# Patient Record
Sex: Male | Born: 1955 | Race: Black or African American | Hispanic: No | Marital: Married | Smoking: Former smoker
Health system: Southern US, Community
[De-identification: ages and names within clinical notes are randomized; demographics above are authoritative.]

## PROBLEM LIST (undated history)

## (undated) DIAGNOSIS — M1990 Unspecified osteoarthritis, unspecified site: Secondary | ICD-10-CM

## (undated) DIAGNOSIS — Z86010 Personal history of colonic polyps: Principal | ICD-10-CM

## (undated) DIAGNOSIS — I10 Essential (primary) hypertension: Secondary | ICD-10-CM

## (undated) HISTORY — DX: Personal history of colonic polyps: Z86.010

## (undated) HISTORY — DX: Unspecified osteoarthritis, unspecified site: M19.90

## (undated) HISTORY — DX: Essential (primary) hypertension: I10

## (undated) HISTORY — PX: COLONOSCOPY: SHX174

---

## 2005-11-20 ENCOUNTER — Ambulatory Visit: Payer: Self-pay | Admitting: Family Medicine

## 2007-01-10 ENCOUNTER — Ambulatory Visit: Payer: Self-pay | Admitting: Family Medicine

## 2007-01-10 DIAGNOSIS — I10 Essential (primary) hypertension: Secondary | ICD-10-CM | POA: Insufficient documentation

## 2007-02-08 ENCOUNTER — Ambulatory Visit: Payer: Self-pay | Admitting: Family Medicine

## 2007-02-08 LAB — CONVERTED CEMR LAB
ALT: 26 units/L (ref 0–53)
AST: 28 units/L (ref 0–37)
Alkaline Phosphatase: 56 units/L (ref 39–117)
BUN: 15 mg/dL (ref 6–23)
Basophils Relative: 0.6 % (ref 0.0–1.0)
Bilirubin, Direct: 0.1 mg/dL (ref 0.0–0.3)
Blood in Urine, dipstick: NEGATIVE
CO2: 31 meq/L (ref 19–32)
Calcium: 10 mg/dL (ref 8.4–10.5)
Chloride: 100 meq/L (ref 96–112)
Cholesterol: 246 mg/dL (ref 0–200)
Eosinophils Absolute: 0.1 10*3/uL (ref 0.0–0.6)
Eosinophils Relative: 0.6 % (ref 0.0–5.0)
GFR calc Af Amer: 92 mL/min
GFR calc non Af Amer: 76 mL/min
Glucose, Bld: 95 mg/dL (ref 70–99)
HDL: 36.2 mg/dL — ABNORMAL LOW (ref >39.0)
Ketones, urine, test strip: NEGATIVE
Monocytes Relative: 7.2 % (ref 3.0–11.0)
Nitrite: NEGATIVE
Platelets: 326 10*3/uL (ref 150–400)
Protein, U semiquant: NEGATIVE
RBC: 4.15 M/uL — ABNORMAL LOW (ref 4.22–5.81)
TSH: 2.11 microintl units/mL (ref 0.35–5.50)
Triglycerides: 134 mg/dL (ref 0–149)
Urobilinogen, UA: 0.2
VLDL: 27 mg/dL (ref 0–40)
WBC: 10.5 10*3/uL (ref 4.5–10.5)

## 2007-02-19 ENCOUNTER — Ambulatory Visit: Payer: Self-pay | Admitting: Family Medicine

## 2007-02-19 DIAGNOSIS — E785 Hyperlipidemia, unspecified: Secondary | ICD-10-CM | POA: Insufficient documentation

## 2007-02-19 DIAGNOSIS — Z87891 Personal history of nicotine dependence: Secondary | ICD-10-CM

## 2007-08-13 ENCOUNTER — Ambulatory Visit: Payer: Self-pay | Admitting: Family Medicine

## 2007-09-12 ENCOUNTER — Ambulatory Visit: Payer: Self-pay | Admitting: Internal Medicine

## 2007-09-26 ENCOUNTER — Ambulatory Visit: Payer: Self-pay | Admitting: Internal Medicine

## 2007-09-26 ENCOUNTER — Encounter: Payer: Self-pay | Admitting: Internal Medicine

## 2007-09-26 DIAGNOSIS — Z860100 Personal history of colon polyps, unspecified: Secondary | ICD-10-CM | POA: Insufficient documentation

## 2007-09-26 HISTORY — DX: Personal history of colonic polyps: Z86.010

## 2007-09-26 HISTORY — DX: Personal history of colon polyps, unspecified: Z86.0100

## 2007-09-30 ENCOUNTER — Encounter: Payer: Self-pay | Admitting: Internal Medicine

## 2008-07-17 ENCOUNTER — Telehealth: Payer: Self-pay | Admitting: Family Medicine

## 2008-07-27 ENCOUNTER — Telehealth: Payer: Self-pay | Admitting: Family Medicine

## 2008-10-27 ENCOUNTER — Telehealth: Payer: Self-pay | Admitting: Family Medicine

## 2008-11-10 ENCOUNTER — Ambulatory Visit: Payer: Self-pay | Admitting: Family Medicine

## 2008-11-10 LAB — CONVERTED CEMR LAB
ALT: 28 units/L (ref 0–53)
Albumin: 4.3 g/dL (ref 3.5–5.2)
BUN: 18 mg/dL (ref 6–23)
Bilirubin, Direct: 0.1 mg/dL (ref 0.0–0.3)
Blood in Urine, dipstick: NEGATIVE
Calcium: 9.7 mg/dL (ref 8.4–10.5)
Cholesterol: 215 mg/dL — ABNORMAL HIGH (ref 0–200)
Creatinine, Ser: 1.3 mg/dL (ref 0.4–1.5)
Eosinophils Relative: 0.4 % (ref 0.0–5.0)
GFR calc non Af Amer: 74.72 mL/min (ref >60)
Glucose, Bld: 106 mg/dL — ABNORMAL HIGH (ref 70–99)
Glucose, Urine, Semiquant: NEGATIVE
HCT: 41.5 % (ref 39.0–52.0)
HDL: 41.3 mg/dL (ref >39.00)
Lymphocytes Relative: 34.3 % (ref 12.0–46.0)
Monocytes Relative: 6.7 % (ref 3.0–12.0)
Neutrophils Relative %: 58.2 % (ref 43.0–77.0)
PSA: 0.87 ng/mL (ref 0.10–4.00)
Platelets: 292 10*3/uL (ref 150.0–400.0)
RDW: 12.6 % (ref 11.5–14.6)
Specific Gravity, Urine: >=1.03
Total Protein: 7.9 g/dL (ref 6.0–8.3)
Triglycerides: 149 mg/dL (ref 0.0–149.0)
VLDL: 29.8 mg/dL (ref 0.0–40.0)
WBC Urine, dipstick: NEGATIVE
WBC: 9.6 10*3/uL (ref 4.5–10.5)
pH: 5.5

## 2008-11-17 ENCOUNTER — Ambulatory Visit: Payer: Self-pay | Admitting: Family Medicine

## 2008-11-17 DIAGNOSIS — N529 Male erectile dysfunction, unspecified: Secondary | ICD-10-CM | POA: Insufficient documentation

## 2008-12-01 ENCOUNTER — Ambulatory Visit: Payer: Self-pay | Admitting: Family Medicine

## 2008-12-04 ENCOUNTER — Telehealth: Payer: Self-pay | Admitting: Family Medicine

## 2010-03-29 ENCOUNTER — Telehealth: Payer: Self-pay | Admitting: Family Medicine

## 2010-04-04 ENCOUNTER — Ambulatory Visit: Admit: 2010-04-04 | Payer: Self-pay | Admitting: Family Medicine

## 2010-04-28 NOTE — Progress Notes (Signed)
Summary: refill  Phone Note Refill Request Call back at Home Phone [phone] Message from:  Patient--live call  Refills Requested: Medication #1:  DYAZIDE 37.5-25 MG CAPS Take 1 tablet by mouth every morning has appt next monday.  Initial call taken by: Warnell Forester,  March 29, 2010 11:07 AM    Prescriptions: Ilsa Iha 37.5-25 MG CAPS (TRIAMTERENE-HCTZ) Take 1 tablet by mouth every morning  #30 x 0   Entered by:   Kern Reap CMA (AAMA)   Authorized by:   Roderick Pee MD   Signed by:   Kern Reap CMA (AAMA) on 03/29/2010   Method used:   Electronically to        [facility]  [address] [phone]* (retail)       [address]       [place], [place]  [address]       Ph: [phone]       Fax: [phone]   RxID:   [id number]

## 2012-09-16 ENCOUNTER — Encounter: Payer: Self-pay | Admitting: Internal Medicine

## 2012-09-19 ENCOUNTER — Ambulatory Visit (AMBULATORY_SURGERY_CENTER): Payer: BC Managed Care – PPO | Admitting: *Deleted

## 2012-09-19 VITALS — Ht 72.0 in | Wt 260.8 lb

## 2012-09-19 DIAGNOSIS — Z1211 Encounter for screening for malignant neoplasm of colon: Secondary | ICD-10-CM

## 2012-09-19 MED ORDER — NA SULFATE-K SULFATE-MG SULF 17.5-3.13-1.6 GM/177ML PO SOLN: ORAL | Status: DC

## 2012-09-20 ENCOUNTER — Encounter: Payer: Self-pay | Admitting: Internal Medicine

## 2012-10-03 ENCOUNTER — Ambulatory Visit (AMBULATORY_SURGERY_CENTER): Payer: BC Managed Care – PPO | Admitting: Internal Medicine

## 2012-10-03 ENCOUNTER — Encounter: Payer: Self-pay | Admitting: Internal Medicine

## 2012-10-03 VITALS — BP 114/66 | HR 54 | Temp 97.0°F | Resp 18 | Ht 72.0 in | Wt 260.0 lb

## 2012-10-03 DIAGNOSIS — D126 Benign neoplasm of colon, unspecified: Secondary | ICD-10-CM

## 2012-10-03 DIAGNOSIS — Z860100 Personal history of colon polyps, unspecified: Secondary | ICD-10-CM

## 2012-10-03 MED ORDER — SODIUM CHLORIDE 0.9 % IV SOLN: 500.0000 mL | INTRAVENOUS | Status: DC

## 2012-10-03 NOTE — Op Note (Addendum)
[facility] [address] [place], [address]   COLONOSCOPY PROCEDURE REPORT  PATIENT: Steven Morton, Steven Morton  MR#: [id number] BIRTHDATE: September 27, 1955 , 57  yrs. old GENDER: Male ENDOSCOPIST: Iva Boop, MD, [facility] PROCEDURE DATE:  10/03/2012 PROCEDURE:   Colonoscopy with biopsy ASA CLASS:   Class II INDICATIONS:Patient's personal history of adenomatous colon polyps and Last colonoscopy performed 5 years ago. MEDICATIONS: propofol (Diprivan) 200mg  IV, MAC sedation, administered by CRNA, and These medications were titrated to patient response per physician's verbal order  DESCRIPTION OF PROCEDURE:   After the risks benefits and alternatives of the procedure were thoroughly explained, informed consent was obtained.  A digital rectal exam revealed no abnormalities of the rectum, A digital rectal exam revealed no prostatic nodules, and A digital rectal exam revealed the prostate was not enlarged.   The     endoscope was introduced through the anus and advanced to the cecum, which was identified by both the appendix and ileocecal valve (photo lost). No adverse events experienced.   The quality of the prep was excellent using Suprep The instrument was then slowly withdrawn as the colon was fully examined.      COLON FINDINGS: A sessile polyp measuring 2 mm in size was found in the proximal transverse colon.  A polypectomy was performed with cold forceps.  The resection was complete and the polyp tissue was completely retrieved.   The colon mucosa was otherwise normal.   A right colon retroflexion was performed.  Retroflexed views revealed no abnormalities. The time to cecum=1 minutes 0 seconds. Withdrawal time=8 minutes 0 seconds.  The scope was withdrawn and the procedure completed. COMPLICATIONS: There were no complications.  ENDOSCOPIC IMPRESSION: 1.   Sessile polyp measuring 2 mm in size was found in the proximal transverse colon; polypectomy was performed with  cold forceps 2.   The colon mucosa was otherwise normal - excellent prep  - second colonoscopy  RECOMMENDATIONS: Timing of repeat colonoscopy will be determined by pathology findings in man with 2 diminutive adenomas removed 2009 He does not want SuPrep next time.   eSigned:  Iva Boop, MD, [facility] 10/03/2012 9:31 AM Revised: 10/03/2012 9:31 AM  cc: The Patient  and Mayra Neer, NP [facility])

## 2012-10-03 NOTE — Progress Notes (Addendum)
Patient did not have preoperative order for IV antibiotic SSI prophylaxis. (G8918)  Patient did not experience any of the following events: a burn prior to discharge; a fall within the facility; wrong site/side/patient/procedure/implant event; or a hospital transfer or hospital admission upon discharge from the facility. (G8907)  

## 2012-10-03 NOTE — Progress Notes (Signed)
Called to room to assist during endoscopic procedure.  Patient ID and intended procedure confirmed with present staff. Received instructions for my participation in the procedure from the performing physician.  

## 2012-10-03 NOTE — Patient Instructions (Addendum)
I found and removed one tiny polyp. Otherwise normal colon with great prep.  I will let you know pathology results and when to have another routine colonoscopy by mail.  I appreciate the opportunity to care for you. Iva Boop, MD, FACG YOU HAD AN ENDOSCOPIC PROCEDURE TODAY AT THE [facility]: Refer to the procedure report that was given to you for any specific questions about what was found during the examination.  If the procedure report does not answer your questions, please call your gastroenterologist to clarify.  If you requested that your care partner not be given the details of your procedure findings, then the procedure report has been included in a sealed envelope for you to review at your convenience later.  YOU SHOULD EXPECT: Some feelings of bloating in the abdomen. Passage of more gas than usual.  Walking can help get rid of the air that was put into your GI tract during the procedure and reduce the bloating. If you had a lower endoscopy (such as a colonoscopy or flexible sigmoidoscopy) you may notice spotting of blood in your stool or on the toilet paper. If you underwent a bowel prep for your procedure, then you may not have a normal bowel movement for a few days.  DIET: Your first meal following the procedure should be a light meal and then it is ok to progress to your normal diet.  A half-sandwich or bowl of soup is an example of a good first meal.  Heavy or fried foods are harder to digest and may make you feel nauseous or bloated.  Likewise meals heavy in dairy and vegetables can cause extra gas to form and this can also increase the bloating.  Drink plenty of fluids but you should avoid alcoholic beverages for 24 hours.  ACTIVITY: Your care partner should take you home directly after the procedure.  You should plan to take it easy, moving slowly for the rest of the day.  You can resume normal activity the day after the procedure however you should NOT DRIVE or use  heavy machinery for 24 hours (because of the sedation medicines used during the test).    SYMPTOMS TO REPORT IMMEDIATELY: A gastroenterologist can be reached at any hour.  During normal business hours, 8:30 AM to 5:00 PM Monday through Friday, call [phone].  After hours and on weekends, please call the GI answering service at [phone] who will take a message and have the physician on call contact you.   Following lower endoscopy (colonoscopy or flexible sigmoidoscopy):  Excessive amounts of blood in the stool  Significant tenderness or worsening of abdominal pains  Swelling of the abdomen that is new, acute  Fever of 100F or higher  FOLLOW UP: If any biopsies were taken you will be contacted by phone or by letter within the next 1-3 weeks.  Call your gastroenterologist if you have not heard about the biopsies in 3 weeks.  Our staff will call the home number listed on your records the next business day following your procedure to check on you and address any questions or concerns that you may have at that time regarding the information given to you following your procedure. This is a courtesy call and so if there is no answer at the home number and we have not heard from you through the emergency physician on call, we will assume that you have returned to your regular daily activities without incident.  SIGNATURES/CONFIDENTIALITY: You and/or your care  partner have signed paperwork which will be entered into your electronic medical record.  These signatures attest to the fact that that the information above on your After Visit Summary has been reviewed and is understood.  Full responsibility of the confidentiality of this discharge information lies with you and/or your care-partner.

## 2012-10-03 NOTE — Progress Notes (Signed)
Lidocaine-40mg IV prior to Propofol InductionPropofol given over incremental dosages 

## 2012-10-04 ENCOUNTER — Telehealth: Payer: Self-pay | Admitting: *Deleted

## 2012-10-04 NOTE — Telephone Encounter (Signed)
  Follow up Call-  Call back number 10/03/2012  Post procedure Call Back phone  # [phone]  Permission to leave phone message Yes     Patient questions:  Do you have a fever, pain , or abdominal swelling? no Pain Score  0 *  Have you tolerated food without any problems? yes  Have you been able to return to your normal activities? yes  Do you have any questions about your discharge instructions: Diet   no Medications  no Follow up visit  no  Do you have questions or concerns about your Care? no  Actions: * If pain score is 4 or above: No action needed, pain <4.

## 2012-10-08 ENCOUNTER — Encounter: Payer: Self-pay | Admitting: Internal Medicine

## 2012-10-08 NOTE — Progress Notes (Signed)
Quick Note:  Not a polyp Repeat colon 09/2022 - 2 small adenomas in past ______

## 2016-04-10 ENCOUNTER — Encounter: Payer: Self-pay | Admitting: Podiatry

## 2016-04-10 ENCOUNTER — Ambulatory Visit (INDEPENDENT_AMBULATORY_CARE_PROVIDER_SITE_OTHER): Payer: 59 | Admitting: Podiatry

## 2016-04-10 ENCOUNTER — Ambulatory Visit (INDEPENDENT_AMBULATORY_CARE_PROVIDER_SITE_OTHER): Payer: 59

## 2016-04-10 VITALS — BP 143/93 | HR 99 | Ht 73.0 in | Wt 267.0 lb

## 2016-04-10 DIAGNOSIS — L600 Ingrowing nail: Secondary | ICD-10-CM | POA: Diagnosis not present

## 2016-04-10 DIAGNOSIS — M21619 Bunion of unspecified foot: Secondary | ICD-10-CM

## 2016-04-10 NOTE — Progress Notes (Signed)
   Subjective:    Patient ID: Steven Morton, male    DOB: 1955/08/25, 61 y.o.   MRN: [id number]  HPI  Chief Complaint  Patient presents with  . Toe Pain    Right foot; 3rd toe x 2 weeks. Pt states that the 2nd toe rubs against the 3rd toe and causes pain when wearing shoes.   . Bunions    BL. Denies having pain.        Review of Systems     Objective:   Physical Exam        Assessment & Plan:

## 2016-04-10 NOTE — Patient Instructions (Signed)

## 2016-04-11 NOTE — Progress Notes (Signed)
Subjective:     Patient ID: Steven Morton, male   DOB: 1955-07-19, 61 y.o.   MRN: [id number]  HPI patient presents stating he's had a history of bunion deformity which can be bothersome at times but currently the third toe of his right foot is what hurting the most has been present for several months   Review of Systems  All other systems reviewed and are negative.      Objective:   Physical Exam  Constitutional: He is oriented to person, place, and time.  Cardiovascular: Intact distal pulses.   Musculoskeletal: Normal range of motion.  Neurological: He is oriented to person, place, and time.  Skin: Skin is warm.  Nursing note and vitals reviewed.  neurovascular status intact with muscle strength adequate and range of motion within normal limits. Patient's noted to have hyperostosis around the medial aspect first metatarsal head bilateral with redness and deviation of the hallux against the second toe and compression between the lesser digits right over left. There is irritation of the medial border of the right third nail which is inflamed and appears to be the major point that he is developing pain. I noted good digital perfusion and well oriented 3     Assessment:     Structural malalignment of the foot bilateral with possibility for ingrown toenail third right versus spur formation or compression from digital structural    Plan:     H&P x-rays reviewed and today were to focus on the nail. I infiltrated the third digit 60 mg Xylocaine Marcaine mixture reviewed with him risk associated with nail surgery and remove the medial border exposing matrix and applying phenol 3 applications 30 seconds followed by alcohol lavage and sterile dressing. Gave instructions on soaks and reappoint to recheck and ultimately will probably require bunion surgery at his convenience  X-ray report indicated that there is elevation of the intermetatarsal angle right over left with deviation of the lesser  digits

## 2018-04-05 ENCOUNTER — Emergency Department (HOSPITAL_COMMUNITY): Payer: PRIVATE HEALTH INSURANCE

## 2018-04-05 ENCOUNTER — Encounter (HOSPITAL_COMMUNITY): Payer: Self-pay

## 2018-04-05 ENCOUNTER — Emergency Department (HOSPITAL_COMMUNITY)
Admission: EM | Admit: 2018-04-05 | Discharge: 2018-04-05 | Disposition: A | Discharge status: Home / Self Care | Payer: PRIVATE HEALTH INSURANCE | Attending: Emergency Medicine | Admitting: Emergency Medicine

## 2018-04-05 ENCOUNTER — Other Ambulatory Visit: Payer: Self-pay

## 2018-04-05 DIAGNOSIS — I10 Essential (primary) hypertension: Secondary | ICD-10-CM | POA: Diagnosis not present

## 2018-04-05 DIAGNOSIS — K858 Other acute pancreatitis without necrosis or infection: Secondary | ICD-10-CM | POA: Insufficient documentation

## 2018-04-05 DIAGNOSIS — R1010 Upper abdominal pain, unspecified: Secondary | ICD-10-CM | POA: Diagnosis present

## 2018-04-05 DIAGNOSIS — Z79899 Other long term (current) drug therapy: Secondary | ICD-10-CM | POA: Diagnosis not present

## 2018-04-05 DIAGNOSIS — I77819 Aortic ectasia, unspecified site: Secondary | ICD-10-CM | POA: Insufficient documentation

## 2018-04-05 LAB — CBC
HCT: 45.4 % (ref 39.0–52.0)
Hemoglobin: 15.5 g/dL (ref 13.0–17.0)
MCH: 33.1 pg (ref 26.0–34.0)
MCHC: 34.1 g/dL (ref 30.0–36.0)
MCV: 97 fL (ref 80.0–100.0)
Platelets: 301 10*3/uL (ref 150–400)
RBC: 4.68 MIL/uL (ref 4.22–5.81)
RDW: 12 % (ref 11.5–15.5)
WBC: 12.7 10*3/uL — ABNORMAL HIGH (ref 4.0–10.5)
nRBC: 0 % (ref 0.0–0.2)

## 2018-04-05 LAB — URINALYSIS, ROUTINE W REFLEX MICROSCOPIC
Bilirubin Urine: NEGATIVE
Glucose, UA: NEGATIVE mg/dL
HGB URINE DIPSTICK: NEGATIVE
Ketones, ur: NEGATIVE mg/dL
Leukocytes, UA: NEGATIVE
Nitrite: NEGATIVE
PH: 5 (ref 5.0–8.0)
Protein, ur: NEGATIVE mg/dL
Specific Gravity, Urine: 1.024 (ref 1.005–1.030)

## 2018-04-05 LAB — COMPREHENSIVE METABOLIC PANEL
ALT: 26 U/L (ref 0–44)
AST: 22 U/L (ref 15–41)
Albumin: 4 g/dL (ref 3.5–5.0)
Alkaline Phosphatase: 62 U/L (ref 38–126)
Anion gap: 10 (ref 5–15)
BUN: 13 mg/dL (ref 6–20)
CO2: 24 mmol/L (ref 22–32)
Calcium: 9.2 mg/dL (ref 8.9–10.3)
Chloride: 100 mmol/L (ref 98–111)
Creatinine, Ser: 1.11 mg/dL (ref 0.61–1.24)
GFR calc Af Amer: >60 mL/min (ref >60)
GFR calc non Af Amer: >60 mL/min (ref >60)
GLUCOSE: 160 mg/dL — AB (ref 70–99)
Potassium: 3.3 mmol/L — ABNORMAL LOW (ref 3.5–5.1)
SODIUM: 134 mmol/L — AB (ref 135–145)
Total Bilirubin: 0.8 mg/dL (ref 0.3–1.2)
Total Protein: 7.7 g/dL (ref 6.5–8.1)

## 2018-04-05 LAB — LIPASE, BLOOD: LIPASE: 900 U/L — AB (ref 11–51)

## 2018-04-05 LAB — I-STAT TROPONIN, ED: Troponin i, poc: 0.01 ng/mL (ref 0.00–0.08)

## 2018-04-05 MED ORDER — SODIUM CHLORIDE 0.9 % IV BOLUS
1000.0000 mL | Freq: Once | INTRAVENOUS | Status: AC
  Administered 2018-04-05: 1000 mL via INTRAVENOUS

## 2018-04-05 MED ORDER — ONDANSETRON 4 MG PO TBDP: 4.0000 mg | ORAL_TABLET | Freq: Three times a day (TID) | ORAL | 0 refills | Status: AC | PRN

## 2018-04-05 MED ORDER — IOHEXOL 300 MG/ML  SOLN
100.0000 mL | Freq: Once | INTRAMUSCULAR | Status: AC | PRN
  Administered 2018-04-05: 100 mL via INTRAVENOUS

## 2018-04-05 MED ORDER — HYDROMORPHONE HCL 1 MG/ML IJ SOLN
1.0000 mg | Freq: Once | INTRAMUSCULAR | Status: AC
  Administered 2018-04-05: 1 mg via INTRAVENOUS
  Filled 2018-04-05: qty 1

## 2018-04-05 MED ORDER — ONDANSETRON HCL 4 MG/2ML IJ SOLN
4.0000 mg | Freq: Once | INTRAMUSCULAR | Status: AC
  Administered 2018-04-05: 4 mg via INTRAVENOUS
  Filled 2018-04-05: qty 2

## 2018-04-05 MED ORDER — OXYCODONE HCL 5 MG PO TABS: 5.0000 mg | ORAL_TABLET | ORAL | 0 refills | Status: AC | PRN

## 2018-04-05 NOTE — ED Notes (Signed)
Patient verbalizes understanding of discharge instructions. Opportunity for questioning and answers were provided. Armband removed by staff, pt discharged from ED ambulatory with wife driving home.

## 2018-04-05 NOTE — ED Provider Notes (Signed)
[facility]ERGENCY DEPARTMENT Provider Note   CSN: [id number] Arrival date & time: 04/05/18  0334     History   Chief Complaint Chief Complaint  Patient presents with  . Abdominal Pain    HPI Steven Morton is a 63 y.o. male.  HPI   63 yo M with PMHx HTN, colonic adenoma here with abdominal pain. Pt states his sx started over a week ago. At that time, he had a "cold" with cough, congestion. He then developed epigastric pain after eating that he thought was from coughing. He saw his PCP and was treated supportively, but did had labs that were reportedly abnormal. However, when the PCP called about labs the pt was asymptomatic 4 days ago, so pt was told to continue to monitor. Over the past 24 hours, he has had recurrence of now severe aching, gnawing, severe, epigastric pain that is constant. It is worse with palpation and eating. He has not taken anything for it. Denies h/o similar sx. No known h/o gallstones, no h/o pancreatitis. Denies any regular alcohol intake, though he did drink on New Years 10 days ago. No fever or chills. No urinary symptoms.  Past Medical History:  Diagnosis Date  . Arthritis    left knee  . Hypertension   . Personal history of colonic adenomas 09/26/2007    Patient Active Problem List   Diagnosis Date Noted  . 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 Personal history of colonic adenomas 09/26/2007  . [place], SEVERE 02/19/2007  . TOBACCO ABUSE, HX OF 02/19/2007  . HYPERTENSION 01/10/2007    Past Surgical History:  Procedure Laterality Date  . COLONOSCOPY          Home Medications    Prior to Admission medications   Medication Sig Start Date End Date Taking? Authorizing Provider  diclofenac (VOLTAREN) 75 MG EC tablet Take 75 mg by mouth 2 (two) times daily.    [provider]  lisinopril-hydrochlorothiazide (PRINZIDE,ZESTORETIC) 20-25 MG per tablet Take 1 tablet by mouth daily.    [provider]   ondansetron (ZOFRAN ODT) 4 MG disintegrating tablet Take 1 tablet (4 mg total) by mouth every 8 (eight) hours as needed for nausea or vomiting. 04/05/18   Duffy Bruce, MD  oxyCODONE (ROXICODONE) 5 MG immediate release tablet Take 1-2 tablets (5-10 mg total) by mouth every 4 (four) hours as needed for moderate pain or severe pain (1 tab for moderate pain, 2 tabs for severe pain). 04/05/18   Duffy Bruce, MD    Family History Family History  Problem Relation Age of Onset  . Colon cancer Neg Hx     Social History Social History   Tobacco Use  . Smoking status: Former Smoker    Last attempt to quit: 06/06/2012    Years since quitting: 5.8  . Smokeless tobacco: Never Used  Substance Use Topics  . Alcohol use: Yes    Comment: 2 drinks a month  . Drug use: No     Allergies   Patient has no known allergies.   Review of Systems Review of Systems  Constitutional: Positive for fatigue. Negative for chills and fever.  HENT: Negative for congestion and rhinorrhea.   Eyes: Negative for visual disturbance.  Respiratory: Negative for cough, shortness of breath and wheezing.   Cardiovascular: Negative for chest pain and leg swelling.  Gastrointestinal: Positive for abdominal pain and nausea. Negative for diarrhea and vomiting.  Genitourinary: Negative for dysuria and flank pain.  Musculoskeletal: Negative for  neck pain and neck stiffness.  Skin: Negative for rash and wound.  Allergic/Immunologic: Negative for immunocompromised state.  Neurological: Negative for syncope, weakness and headaches.  All other systems reviewed and are negative.    Physical Exam Updated Vital Signs BP 117/71   Pulse 75   Temp 98.8 F (37.1 C) (Oral)   Resp (!) 21   Ht 6\' 1"  (1.854 m)   Wt 118.8 kg   SpO2 92%   BMI 34.57 kg/m   Physical Exam Vitals signs and nursing note reviewed.  Constitutional:      General: He is not in acute distress.    Appearance: He is well-developed.  HENT:      Head: Normocephalic and atraumatic.  Eyes:     Conjunctiva/sclera: Conjunctivae normal.  Neck:     Musculoskeletal: Neck supple.  Cardiovascular:     Rate and Rhythm: Normal rate and regular rhythm.     Heart sounds: Normal heart sounds. No murmur. No friction rub.  Pulmonary:     Effort: Pulmonary effort is normal. No respiratory distress.     Breath sounds: Normal breath sounds. No wheezing or rales.  Abdominal:     General: There is no distension.     Palpations: Abdomen is soft.     Tenderness: There is abdominal tenderness in the right upper quadrant and epigastric area. There is guarding. There is no rebound.  Skin:    General: Skin is warm.     Capillary Refill: Capillary refill takes less than 2 seconds.  Neurological:     Mental Status: He is alert and oriented to person, place, and time.     Motor: No abnormal muscle tone.      ED Treatments / Results  Labs (all labs ordered are listed, but only abnormal results are displayed) Labs Reviewed  LIPASE, BLOOD - Abnormal; Notable for the following components:      Result Value   Lipase 900 (*)    All other components within normal limits  COMPREHENSIVE METABOLIC PANEL - Abnormal; Notable for the following components:   Sodium 134 (*)    Potassium 3.3 (*)    Glucose, Bld 160 (*)    All other components within normal limits  CBC - Abnormal; Notable for the following components:   WBC 12.7 (*)    All other components within normal limits  URINALYSIS, ROUTINE W REFLEX MICROSCOPIC  I-STAT TROPONIN, ED    EKG EKG Interpretation  Date/Time:  Friday April 05 2018 04:56:49 EST Ventricular Rate:  87 PR Interval:    QRS Duration: 79 QT Interval:  352 QTC Calculation: 424 R Axis:   -10 Text Interpretation:  Sinus rhythm Low voltage, precordial leads No significant change since last tracing Confirmed by Duffy Bruce [phone]) on 04/05/2018 5:10:41 AM   Radiology Dg Chest 2 View  Result Date: 04/05/2018 CLINICAL  DATA:  Cough and abdominal pain EXAM: CHEST - 2 VIEW COMPARISON:  None. FINDINGS: Normal heart size and mediastinal contours. No acute infiltrate or edema. No effusion or pneumothorax. Spondylosis. No acute osseous findings. IMPRESSION: No evidence of active disease. Electronically Signed   By: Monte Fantasia M.D.   On: 04/05/2018 05:54   Ct Abdomen Pelvis W Contrast  Result Date: 04/05/2018 CLINICAL DATA:  63 y/o M; upper abdominal pain below the sternal notch. EXAM: CT ABDOMEN AND PELVIS WITH CONTRAST TECHNIQUE: Multidetector CT imaging of the abdomen and pelvis was performed using the standard protocol following bolus administration of intravenous contrast. CONTRAST:  159mL OMNIPAQUE IOHEXOL 300 MG/ML  SOLN COMPARISON:  None. FINDINGS: Lower chest: No acute abnormality. Hepatobiliary: No focal liver abnormality is seen. No gallstones, gallbladder wall thickening, or biliary dilatation. Pancreas: Edema surrounding head of pancreas. No acute peripancreatic collection or findings of pancreatic necrosis. Spleen: Normal in size without focal abnormality. Adrenals/Urinary Tract: Adrenal glands are unremarkable. Kidneys are normal, without renal calculi, focal lesion, or hydronephrosis. Bladder is unremarkable. Stomach/Bowel: Stomach is within normal limits. Appendix appears normal. No evidence of bowel wall thickening, distention, or inflammatory changes. Vascular/Lymphatic: Aortic atherosclerosis. No enlarged abdominal or pelvic lymph nodes. 26 mm abdominal aortic ectasia at the bifurcation. Reproductive: Prostate is unremarkable. Other: 15 mm dermal cyst in the midline lumbar region. Musculoskeletal: No fracture is seen. Multilevel degenerative changes of the visible thoracic and lumbar spine. IMPRESSION: 1. Edema surrounding head of pancreas, consistent with acute pancreatitis. No acute peripancreatic collection or findings of pancreatic necrosis. 2. 26 mm abdominal aortic ectasia at the bifurcation. Ectatic  abdominal aorta at risk for aneurysm development. Recommend followup by ultrasound in 5 years. This recommendation follows ACR consensus guidelines: White Paper of the ACR Incidental Findings Committee II on Vascular Findings. J Am Coll Radiol 2013; 10:789-794. Aortic aneurysm NOS (ICD10-I71.9) 3.  Aortic Atherosclerosis (ICD10-I70.0). Electronically Signed   By: Kristine Garbe M.D.   On: 04/05/2018 06:15    Procedures Procedures (including critical care time)  Medications Ordered in ED Medications  HYDROmorphone (DILAUDID) injection 1 mg (1 mg Intravenous Given 04/05/18 0512)  ondansetron (ZOFRAN) injection 4 mg (4 mg Intravenous Given 04/05/18 0512)  sodium chloride 0.9 % bolus 1,000 mL (0 mLs Intravenous Stopped 04/05/18 0717)  iohexol (OMNIPAQUE) 300 MG/ML solution 100 mL (100 mLs Intravenous Contrast Given 04/05/18 0539)  HYDROmorphone (DILAUDID) injection 1 mg (1 mg Intravenous Given 04/05/18 9675)     Initial Impression / Assessment and Plan / ED Course  I have reviewed the triage vital signs and the nursing notes.  Pertinent labs & imaging results that were available during my care of the patient were reviewed by me and considered in my medical decision making (see chart for details).     62 yo M here with epigastric pain. No vomiting. Began after drinking for New Years. Suspect acute pancreatitis. CT imaging confirms mild acute pancreatitis without complication. He is HDS and has no signs to suggest sepsis, SIRS, necrosis, or hemorrhage. His sx are markedly improved with analgesia and he is tolerating clears. Discussed management options with pt, including admission. Given that his sx are now mild and he is tolerating clears, he would like to attempt outpt management which I think is reasonable. I have provided him with pancreatitis diet instructions, return precautions, analgesics, and antiemetics. Return precautions reiterated with pt and wife.  Final Clinical Impressions(s)  / ED Diagnoses   Final diagnoses:  Other acute pancreatitis without infection or necrosis  Aortic ectasia [facility])    ED Discharge Orders         Ordered    oxyCODONE (ROXICODONE) 5 MG immediate release tablet  Every 4 hours PRN     04/05/18 0700    ondansetron (ZOFRAN ODT) 4 MG disintegrating tablet  Every 8 hours PRN     04/05/18 0700           Duffy Bruce, MD 04/05/18 1600

## 2018-04-05 NOTE — ED Triage Notes (Signed)
Pt here for upper abd pain below sternal notch. Reports pain started Sunday he seen his PMD and had xrays done and stool cultures and blood work. Reports he was given meds for constipation and tramadol for pain. He was called by his doctor and was told "by your bloodwork you should be in pain" but no futhur explanation was given. Today had pain that returned and some nausea. Recently had some cold and URI symptoms and was taking coricidin hbp and those symptoms have subsided.

## 2019-06-12 ENCOUNTER — Ambulatory Visit: Payer: PRIVATE HEALTH INSURANCE | Attending: Family

## 2019-06-12 DIAGNOSIS — Z23 Encounter for immunization: Secondary | ICD-10-CM

## 2019-06-12 NOTE — Progress Notes (Signed)
   Covid-19 Vaccination Clinic  Name:  Urban Chilcote    MRN: [id number] DOB: 1956/02/02  06/12/2019  Mr. Remmick was observed post Covid-19 immunization for 15 minutes without incident. He was provided with Vaccine Information Sheet and instruction to access the V-Safe system.   Mr. Stallman was instructed to call 911 with any severe reactions post vaccine: Marland Kitchen Difficulty breathing  . Swelling of face and throat  . A fast heartbeat  . A bad rash all over body  . Dizziness and weakness   Immunizations Administered    Name Date Dose VIS Date Route   Moderna COVID-19 Vaccine 06/12/2019  3:17 PM 0.5 mL 02/25/2019 Intramuscular   Manufacturer: Moderna   Lot: [id number]   [id number]

## 2019-07-15 ENCOUNTER — Ambulatory Visit: Payer: PRIVATE HEALTH INSURANCE | Attending: Family

## 2019-07-15 DIAGNOSIS — Z23 Encounter for immunization: Secondary | ICD-10-CM

## 2019-07-15 NOTE — Progress Notes (Signed)
   Covid-19 Vaccination Clinic  Name:  Leary Babayev    MRN: [id number] DOB: 1955-08-25  07/15/2019  Mr. Brothers was observed post Covid-19 immunization for 15 minutes without incident. He was provided with Vaccine Information Sheet and instruction to access the V-Safe system.   Mr. Bittel was instructed to call 911 with any severe reactions post vaccine: Marland Kitchen Difficulty breathing  . Swelling of face and throat  . A fast heartbeat  . A bad rash all over body  . Dizziness and weakness   Immunizations Administered    Name Date Dose VIS Date Route   Moderna COVID-19 Vaccine 07/15/2019  1:48 PM 0.5 mL 02/2019 Intramuscular   Manufacturer: Moderna   Lot: [id number]   [id number]

## 2020-02-13 IMAGING — CT CT ABD-PELV W/ CM
2 of 5 series · 17 of 46 positions shown, 19 images · IV contrast (Omni 300)
Comparison: None.

CLINICAL DATA: 62 y/o M; upper abdominal pain below the sternal
notch.

EXAM:
CT ABDOMEN AND PELVIS WITH CONTRAST
TECHNIQUE: Multidetector CT imaging of the abdomen and pelvis was performed
using the standard protocol following bolus administration of
intravenous contrast.
CONTRAST:  100mL OMNIPAQUE IOHEXOL 300 MG/ML  SOLN

[Series 3: a/p w/ 5mm · axial · 0.98mm/px · z∈[+898,+1358]mm · 14 of 104 slices shown, 16 images]
[im 6/104  soft-tissue]
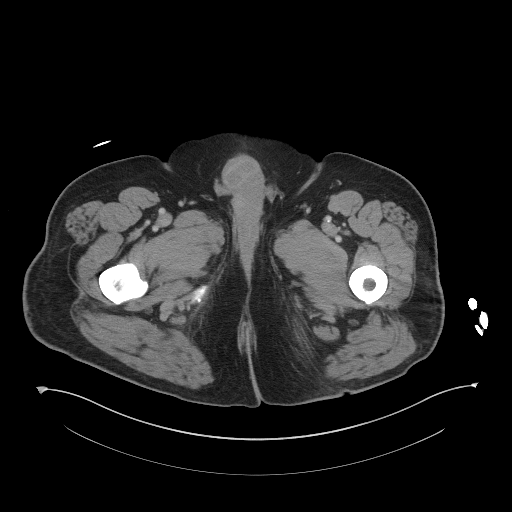
[im 6/104  bone]
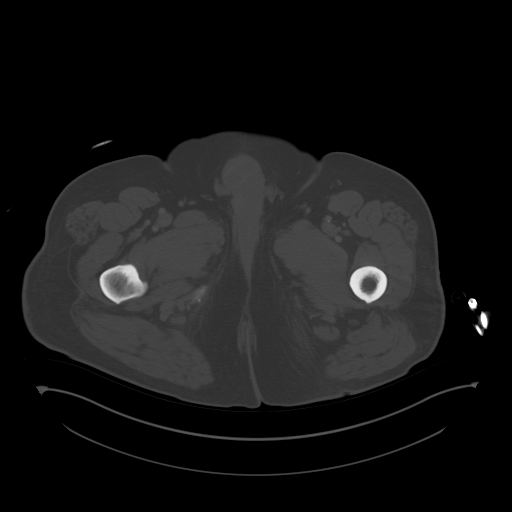
[im 11/104  soft-tissue]
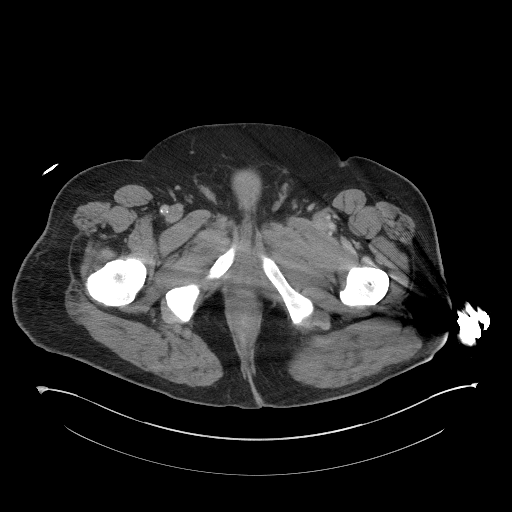
[im 22/104  soft-tissue]
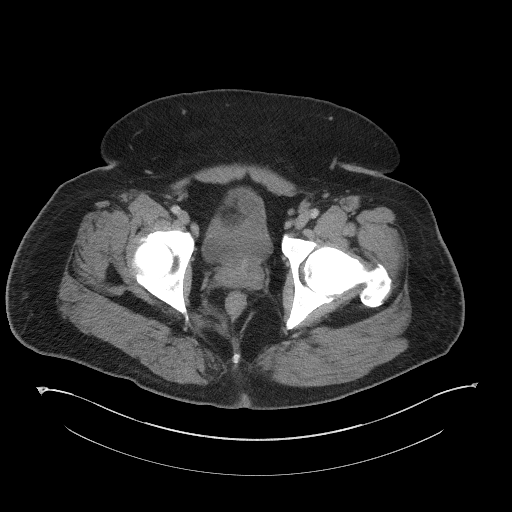
[im 28/104  soft-tissue]
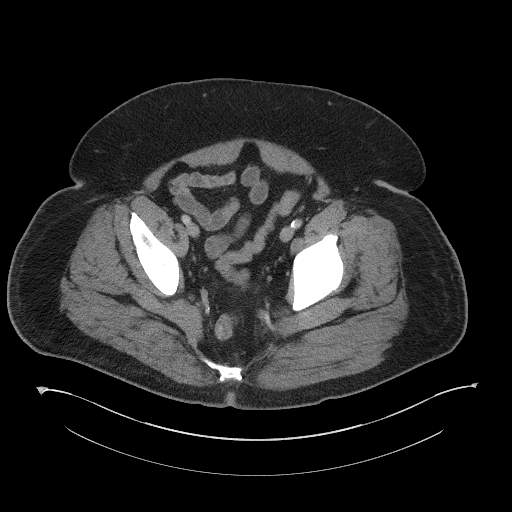
[im 33/104  soft-tissue]
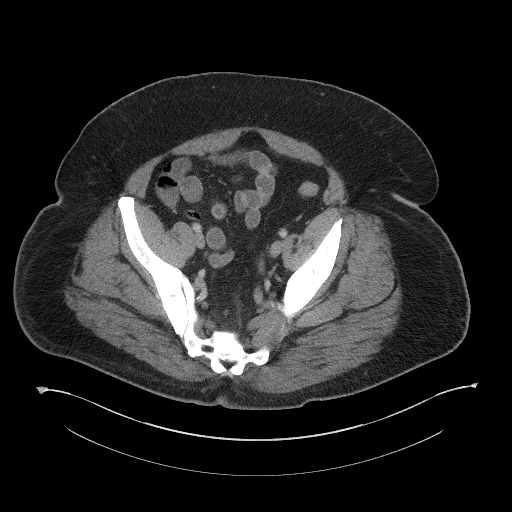
[im 44/104  soft-tissue]
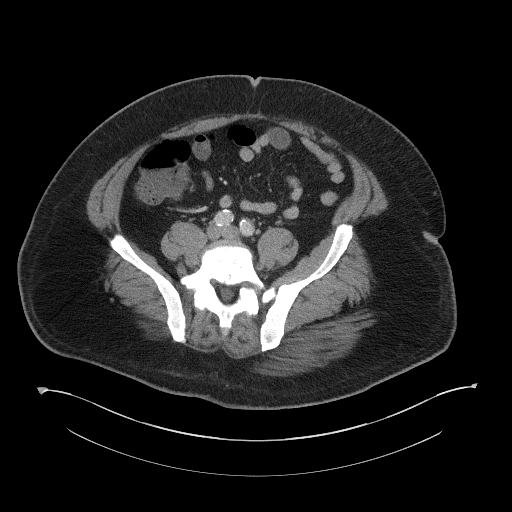
[im 49/104  soft-tissue]
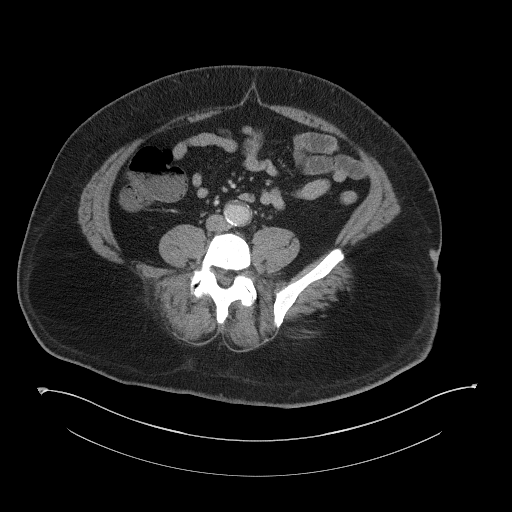
[im 55/104  soft-tissue]
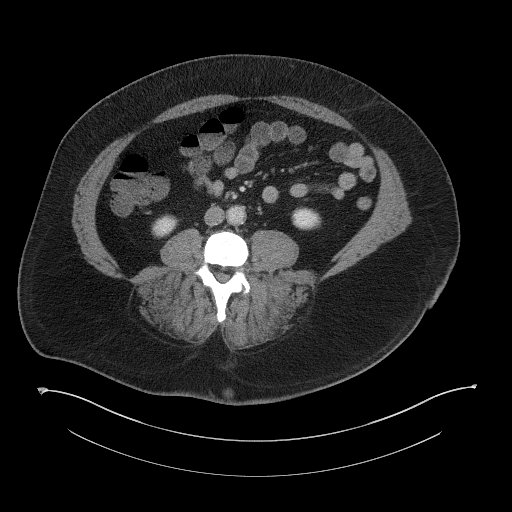
[im 60/104  soft-tissue]
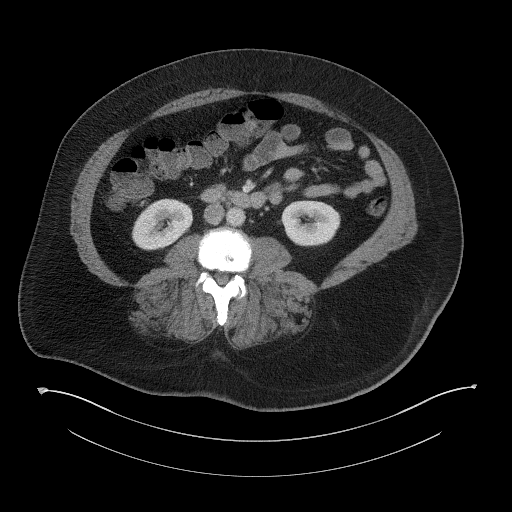
[im 60/104  bone]
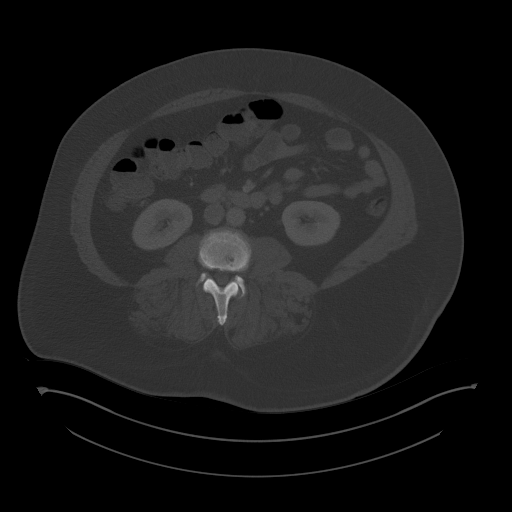
[im 71/104  soft-tissue]
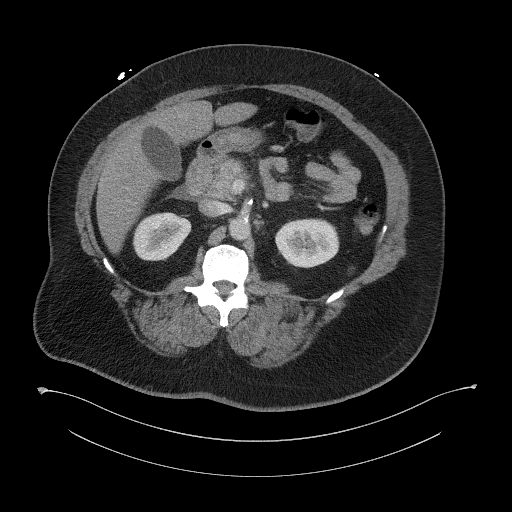
[im 76/104  soft-tissue]
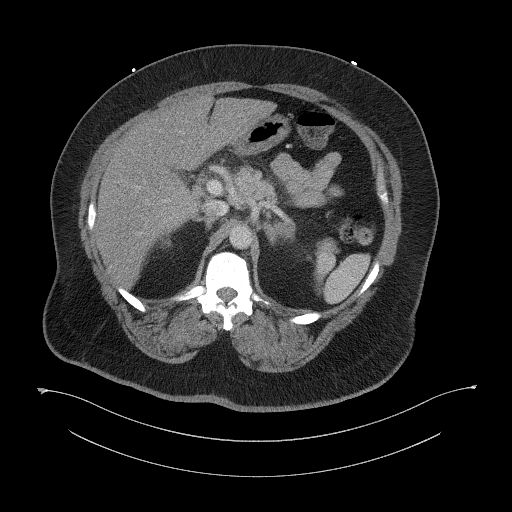
[im 82/104  soft-tissue]
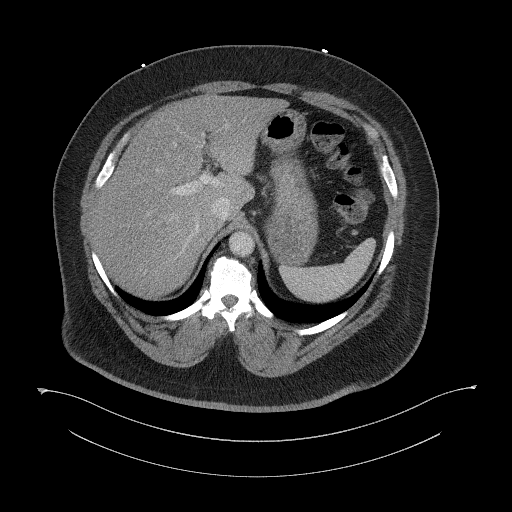
[im 93/104  soft-tissue]
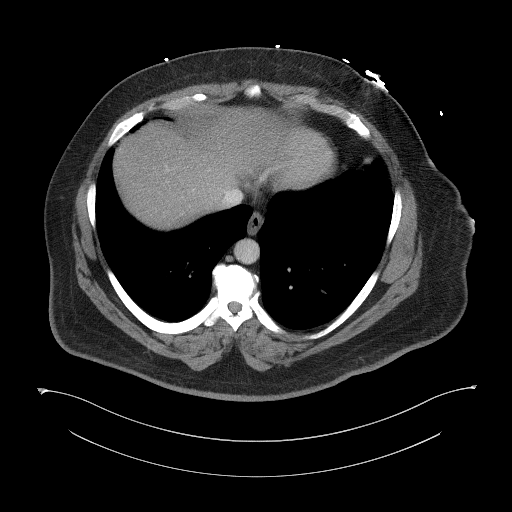
[im 98/104  soft-tissue]
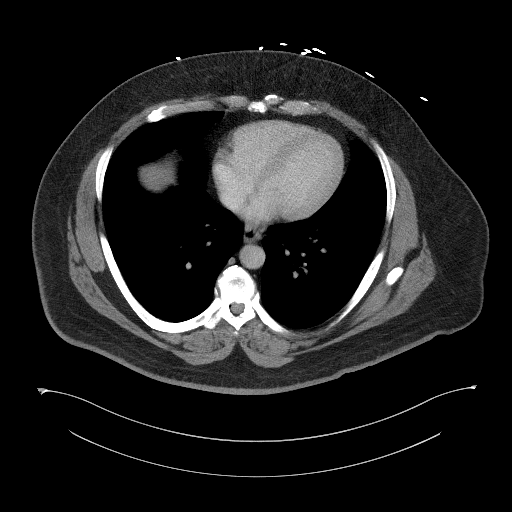

[Series 6: a/p w/ cor · coronal · 0.99mm/px · 3 of 187 slices shown]
[im 63/187  soft-tissue]
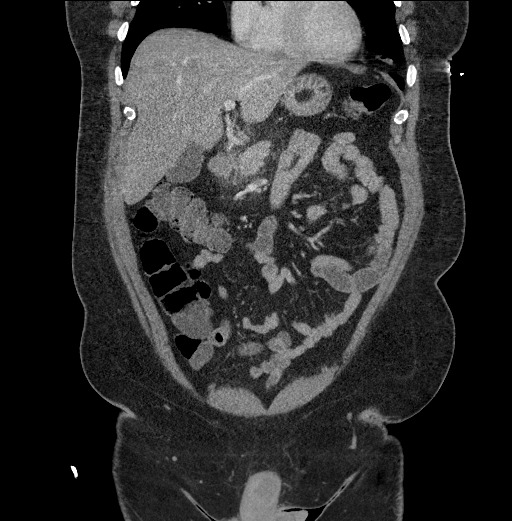
[im 83/187  soft-tissue]
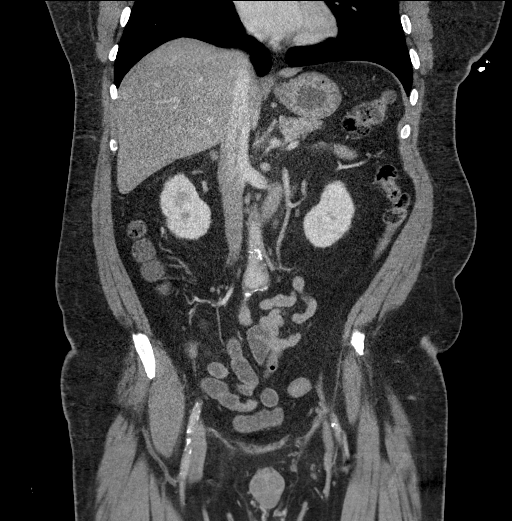
[im 104/187  soft-tissue]
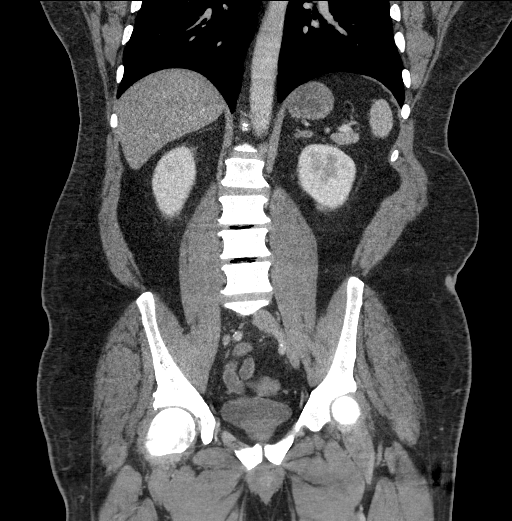

[17 of 46 positions shown; findings below may reference images not displayed]

FINDINGS: Lower chest: No acute abnormality.

Hepatobiliary: No focal liver abnormality is seen. No gallstones,
gallbladder wall thickening, or biliary dilatation.

Pancreas: Edema surrounding head of pancreas. No acute
peripancreatic collection or findings of pancreatic necrosis.

Spleen: Normal in size without focal abnormality.

Adrenals/Urinary Tract: Adrenal glands are unremarkable. Kidneys are
normal, without renal calculi, focal lesion, or hydronephrosis.
Bladder is unremarkable.

Stomach/Bowel: Stomach is within normal limits. Appendix appears
normal. No evidence of bowel wall thickening, distention, or
inflammatory changes.

Vascular/Lymphatic: Aortic atherosclerosis. No enlarged abdominal or
pelvic lymph nodes. 26 mm abdominal aortic ectasia at the
bifurcation.

Reproductive: Prostate is unremarkable.

Other: 15 mm dermal cyst in the midline lumbar region.

Musculoskeletal: No fracture is seen. Multilevel degenerative
changes of the visible thoracic and lumbar spine.
IMPRESSION: 1. Edema surrounding head of pancreas, consistent with acute
pancreatitis. No acute peripancreatic collection or findings of
pancreatic necrosis.
2. 26 mm abdominal aortic ectasia at the bifurcation. Ectatic
abdominal aorta at risk for aneurysm development. Recommend followup
by ultrasound in 5 years. This recommendation follows ACR consensus
guidelines: White Paper of the ACR Incidental Findings Committee II
Aortic aneurysm NOS (Y1F1H-DIS.V)
3.  Aortic Atherosclerosis (Y1F1H-2FW.W).

## 2020-03-02 ENCOUNTER — Ambulatory Visit: Payer: PRIVATE HEALTH INSURANCE | Attending: Internal Medicine

## 2020-03-02 DIAGNOSIS — Z23 Encounter for immunization: Secondary | ICD-10-CM

## 2020-03-02 NOTE — Progress Notes (Signed)
   Covid-[facility] Vaccination [facility]  Name:  Burnell Hurta    MRN: [id number] DOB: 10-04-1955  03/02/2020  Mr. Pilch was observed post Covid-19 immunization for 15 minutes without incident. He was provided with Vaccine Information Sheet and instruction to access the V-Safe system.   Mr. Chuba was instructed to call 911 with any severe reactions post vaccine: Marland Kitchen Difficulty breathing  . Swelling of face and throat  . A fast heartbeat  . A bad rash all over body  . Dizziness and weakness   Immunizations Administered    No immunizations on file.

## 2021-08-08 ENCOUNTER — Ambulatory Visit (HOSPITAL_COMMUNITY)
Admission: EM | Admit: 2021-08-08 | Discharge: 2021-08-08 | Disposition: A | Discharge status: Home / Self Care | Payer: Non-veteran care | Attending: Internal Medicine | Admitting: Internal Medicine

## 2021-08-08 ENCOUNTER — Encounter (HOSPITAL_COMMUNITY): Payer: Self-pay | Admitting: Emergency Medicine

## 2021-08-08 ENCOUNTER — Ambulatory Visit (INDEPENDENT_AMBULATORY_CARE_PROVIDER_SITE_OTHER): Payer: Non-veteran care

## 2021-08-08 ENCOUNTER — Telehealth (HOSPITAL_COMMUNITY): Payer: Self-pay

## 2021-08-08 DIAGNOSIS — M25561 Pain in right knee: Secondary | ICD-10-CM

## 2021-08-08 MED ORDER — PREDNISONE 10 MG (21) PO TBPK: ORAL_TABLET | Freq: Every day | ORAL | 0 refills | Status: DC

## 2021-08-08 MED ORDER — PREDNISONE 10 MG (21) PO TBPK: ORAL_TABLET | Freq: Every day | ORAL | 0 refills | Status: AC

## 2021-08-08 NOTE — ED Triage Notes (Signed)
Pt is present today with right knee pain. Pt states pain started Saturday. Pt denies injruy ?

## 2021-08-08 NOTE — Discharge Instructions (Signed)
It appears that you have osteoarthritis of your right knee.  This is being treated with steroid medication.  Please avoid taking diclofenac if able while taking prednisone steroid.  Follow-up with orthopedist for further evaluation and management. ?

## 2021-08-08 NOTE — ED Provider Notes (Addendum)
?[facility] ? ? ? ?CSN: [id number] ?Arrival date & time: 08/08/21  2440 ? ? ?  ? ?History   ?Chief Complaint ?Chief Complaint  ?Patient presents with  ? Knee Pain  ? ? ?HPI ?Steven Morton is a 66 y.o. male.  ? ?Patient presents with right knee pain that started approximately 3 days ago.  Denies any apparent injury to the area.  Patient reports that he has osteoarthritis in the left knee but has never been diagnosed with that within the right knee.  He has had intermittent soreness in the right knee at times but reports that he has never been in this severe pain.  Pain is present in the left anterior right knee.  Mild swelling noted at area of tenderness.  Denies any numbness or tingling.  Pain occurs with bearing weight.  Patient has taken diclofenac that he takes daily for left knee osteoarthritis with no improvement in pain. ? ? ?Knee Pain ? ?Past Medical History:  ?Diagnosis Date  ? Arthritis   ? left knee  ? Hypertension   ? Personal history of colonic adenomas 09/26/2007  ? ? ?Patient Active Problem List  ? Diagnosis Date Noted  ? 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 Personal history of colonic adenomas 09/26/2007  ? HYPERLIPIDEMIA, SEVERE 02/19/2007  ? TOBACCO ABUSE, HX OF 02/19/2007  ? HYPERTENSION 01/10/2007  ? ? ?Past Surgical History:  ?Procedure Laterality Date  ? COLONOSCOPY    ? ? ? ? ? ?Home Medications   ? ?Prior to Admission medications   ?Medication Sig Start Date End Date Taking? Authorizing Provider  ?predniSONE (STERAPRED UNI-PAK 21 TAB) 10 MG (21) TBPK tablet Take by mouth daily. Take 6 tabs by mouth daily  for 2 days, then 5 tabs for 2 days, then 4 tabs for 2 days, then 3 tabs for 2 days, 2 tabs for 2 days, then 1 tab by mouth daily for 2 days 08/08/21  Yes Teodora Medici, FNP  ?diclofenac (VOLTAREN) 75 MG EC tablet Take 75 mg by mouth 2 (two) times daily.    [provider]  ?lisinopril-hydrochlorothiazide (PRINZIDE,ZESTORETIC) 20-25 MG per tablet Take 1 tablet by mouth  daily.    [provider]  ?ondansetron (ZOFRAN ODT) 4 MG disintegrating tablet Take 1 tablet (4 mg total) by mouth every 8 (eight) hours as needed for nausea or vomiting. 04/05/18   Duffy Bruce, MD  ?oxyCODONE (ROXICODONE) 5 MG immediate release tablet Take 1-2 tablets (5-10 mg total) by mouth every 4 (four) hours as needed for moderate pain or severe pain (1 tab for moderate pain, 2 tabs for severe pain). 04/05/18   Duffy Bruce, MD  ? ? ?Family History ?Family History  ?Problem Relation Age of Onset  ? Colon cancer Neg Hx   ? ? ?Social History ?Social History  ? ?Tobacco Use  ? Smoking status: Former  ?  Types: Cigarettes  ?  Quit date: 06/06/2012  ?  Years since quitting: 9.1  ? Smokeless tobacco: Never  ?Substance Use Topics  ? Alcohol use: Yes  ?  Comment: 2 drinks a month  ? Drug use: No  ? ? ? ?Allergies   ?Patient has no known allergies. ? ? ?Review of Systems ?Review of Systems ?Per HPI ?Physical Exam ?Triage Vital Signs ?ED Triage Vitals [08/08/21 1014]  ?Enc Vitals Group  ?   BP (!) 166/88  ?   Pulse Rate 69  ?   Resp 18  ?   Temp 98.6 ?  F (37 ?C)  ?   Temp Source Oral  ?   SpO2 100 %  ?   Weight   ?   Height   ?   Head Circumference   ?   Peak Flow   ?   Pain Score 8  ?   Pain Loc   ?   Pain Edu?   ?   Excl. in [place]?   ? ?No data found. ? ?Updated Vital Signs ?BP (!) 166/88   Pulse 69   Temp 98.6 ?F (37 ?C) (Oral)   Resp 18   SpO2 100%  ? ?Visual Acuity ?Right Eye Distance:   ?Left Eye Distance:   ?Bilateral Distance:   ? ?Right Eye Near:   ?Left Eye Near:    ?Bilateral Near:    ? ?Physical Exam ?Constitutional:   ?   General: He is not in acute distress. ?   Appearance: Normal appearance. He is not toxic-appearing or diaphoretic.  ?HENT:  ?   Head: Normocephalic and atraumatic.  ?Eyes:  ?   Extraocular Movements: Extraocular movements intact.  ?   Conjunctiva/sclera: Conjunctivae normal.  ?Pulmonary:  ?   Effort: Pulmonary effort is normal.  ?Musculoskeletal:  ?   Right knee: No LCL  laxity, MCL laxity, ACL laxity or PCL laxity. Normal meniscus and normal patellar mobility. Normal pulse.  ?   Comments: Tenderness to palpation to medial right knee with associated swelling.  No obvious erythema or discoloration noted.  Patient has full range of motion of the knee.  Neurovascular intact.  ?Neurological:  ?   General: No focal deficit present.  ?   Mental Status: He is alert and oriented to person, place, and time. Mental status is at baseline.  ?Psychiatric:     ?   Mood and Affect: Mood normal.     ?   Behavior: Behavior normal.     ?   Thought Content: Thought content normal.     ?   Judgment: Judgment normal.  ? ? ? ?UC Treatments / Results  ?Labs ?(all labs ordered are listed, but only abnormal results are displayed) ?Labs Reviewed - No data to display ? ?EKG ? ? ?Radiology ?DG Knee Complete 4 Views Right ? ?Result Date: 08/08/2021 ?CLINICAL DATA:  Right knee pain since Saturday.  No injury. EXAM: RIGHT KNEE - COMPLETE 4+ VIEW COMPARISON:  None Available. FINDINGS: No acute fracture or dislocation. No joint effusion. Mild-to-moderate medial and patellofemoral compartment joint space narrowing with small marginal osteophytes. Bone mineralization is normal. Soft tissues are unremarkable. IMPRESSION: 1.  No acute osseous abnormality. 2. Mild-to-moderate medial and patellofemoral compartment osteoarthritis. Electronically Signed   By: Titus Dubin M.D.   On: 08/08/2021 10:40   ? ?Procedures ?Procedures (including critical care time) ? ?Medications Ordered in UC ?Medications - No data to display ? ?Initial Impression / Assessment and Plan / UC Course  ?I have reviewed the triage vital signs and the nursing notes. ? ?Pertinent labs & imaging results that were available during my care of the patient were reviewed by me and considered in my medical decision making (see chart for details). ? ?  ? ?Right knee x-ray showing osteoarthritis.  No other acute bony abnormality.  Will treat with prednisone  steroid given NSAIDs have not been helpful.  Patient to temporarily stop diclofenac while taking prednisone.  Discussed supportive care with patient as well.  Patient reports that he has established orthopedist with appointment in July so he was  encouraged to follow-up with orthopedist at the scheduled appointment or sooner if necessary.  There does not appear to be any contraindications to prednisone at this time.  Discussed strict return precautions.  Patient verbalized understanding and was agreeable with plan. ?Final Clinical Impressions(s) / UC Diagnoses  ? ?Final diagnoses:  ?Acute pain of right knee  ? ? ? ?Discharge Instructions   ? ?  ?It appears that you have osteoarthritis of your right knee.  This is being treated with steroid medication.  Please avoid taking diclofenac if able while taking prednisone steroid.  Follow-up with orthopedist for further evaluation and management. ? ? ? ? ?ED Prescriptions   ? ? Medication Sig Dispense Auth. Provider  ? predniSONE (STERAPRED UNI-PAK 21 TAB) 10 MG (21) TBPK tablet Take by mouth daily. Take 6 tabs by mouth daily  for 2 days, then 5 tabs for 2 days, then 4 tabs for 2 days, then 3 tabs for 2 days, 2 tabs for 2 days, then 1 tab by mouth daily for 2 days 42 tablet Welch, Michele Rockers, [place]  ? ?  ? ?PDMP not reviewed this encounter. ?  ?Teodora Medici, [place] ?08/08/21 1102 ? ?  ?Teodora Medici, [place] ?08/08/21 1102 ? ?

## 2022-06-09 DIAGNOSIS — H5203 Hypermetropia, bilateral: Secondary | ICD-10-CM | POA: Diagnosis not present

## 2022-08-14 ENCOUNTER — Other Ambulatory Visit: Payer: Self-pay | Admitting: Internal Medicine

## 2022-08-14 DIAGNOSIS — Z13220 Encounter for screening for lipoid disorders: Secondary | ICD-10-CM | POA: Diagnosis not present

## 2022-08-14 DIAGNOSIS — I10 Essential (primary) hypertension: Secondary | ICD-10-CM | POA: Diagnosis not present

## 2022-08-14 DIAGNOSIS — Z0000 Encounter for general adult medical examination without abnormal findings: Secondary | ICD-10-CM | POA: Diagnosis not present

## 2022-08-14 DIAGNOSIS — M5136 Other intervertebral disc degeneration, lumbar region: Secondary | ICD-10-CM | POA: Diagnosis not present

## 2022-08-14 DIAGNOSIS — E559 Vitamin D deficiency, unspecified: Secondary | ICD-10-CM | POA: Diagnosis not present

## 2022-08-14 DIAGNOSIS — Z131 Encounter for screening for diabetes mellitus: Secondary | ICD-10-CM | POA: Diagnosis not present

## 2022-08-14 DIAGNOSIS — Z125 Encounter for screening for malignant neoplasm of prostate: Secondary | ICD-10-CM | POA: Diagnosis not present

## 2022-08-14 DIAGNOSIS — E669 Obesity, unspecified: Secondary | ICD-10-CM | POA: Diagnosis not present

## 2022-08-14 DIAGNOSIS — M179 Osteoarthritis of knee, unspecified: Secondary | ICD-10-CM | POA: Diagnosis not present

## 2022-08-15 LAB — CBC
HCT: 46.3 % (ref 38.5–50.0)
Hemoglobin: 15.7 g/dL (ref 13.2–17.1)
MCH: 33.9 pg — ABNORMAL HIGH (ref 27.0–33.0)
MCHC: 33.9 g/dL (ref 32.0–36.0)
MCV: 100 fL (ref 80.0–100.0)
MPV: 9.6 fL (ref 7.5–12.5)
Platelets: 293 10*3/uL (ref 140–400)
RBC: 4.63 10*6/uL (ref 4.20–5.80)
RDW: 13.1 % (ref 11.0–15.0)
WBC: 10.1 10*3/uL (ref 3.8–10.8)

## 2022-08-15 LAB — COMPLETE METABOLIC PANEL WITH GFR
AG Ratio: 1.6 (calc) (ref 1.0–2.5)
ALT: 17 U/L (ref 9–46)
AST: 22 U/L (ref 10–35)
Albumin: 4.5 g/dL (ref 3.6–5.1)
Alkaline phosphatase (APISO): 63 U/L (ref 35–144)
BUN: 15 mg/dL (ref 7–25)
CO2: 25 mmol/L (ref 20–32)
Calcium: 9.4 mg/dL (ref 8.6–10.3)
Chloride: 102 mmol/L (ref 98–110)
Creat: 1.09 mg/dL (ref 0.70–1.35)
Globulin: 2.9 g/dL (calc) (ref 1.9–3.7)
Glucose, Bld: 79 mg/dL (ref 65–99)
Potassium: 3.5 mmol/L (ref 3.5–5.3)
Sodium: 140 mmol/L (ref 135–146)
Total Bilirubin: 0.4 mg/dL (ref 0.2–1.2)
Total Protein: 7.4 g/dL (ref 6.1–8.1)
eGFR: 75 mL/min/{1.73_m2} (ref >=60)

## 2022-08-15 LAB — LIPID PANEL
Cholesterol: 250 mg/dL — ABNORMAL HIGH (ref <200)
HDL: 54 mg/dL (ref >=40)
LDL Cholesterol (Calc): 150 mg/dL (calc) — ABNORMAL HIGH
Non-HDL Cholesterol (Calc): 196 mg/dL (calc) — ABNORMAL HIGH (ref <130)
Total CHOL/HDL Ratio: 4.6 (calc) (ref <5.0)
Triglycerides: 278 mg/dL — ABNORMAL HIGH (ref <150)

## 2022-08-15 LAB — PSA: PSA: 2.19 ng/mL (ref <=4.00)

## 2022-08-15 LAB — SPECIMEN COMPROMISED

## 2022-08-15 LAB — VITAMIN D 25 HYDROXY (VIT D DEFICIENCY, FRACTURES): Vit D, 25-Hydroxy: 21 ng/mL — ABNORMAL LOW (ref 30–100)

## 2022-08-15 LAB — TSH: TSH: 1.54 mIU/L (ref 0.40–4.50)

## 2022-11-22 DIAGNOSIS — E7849 Other hyperlipidemia: Secondary | ICD-10-CM | POA: Diagnosis not present

## 2022-11-22 DIAGNOSIS — I10 Essential (primary) hypertension: Secondary | ICD-10-CM | POA: Diagnosis not present

## 2022-11-22 DIAGNOSIS — M179 Osteoarthritis of knee, unspecified: Secondary | ICD-10-CM | POA: Diagnosis not present

## 2023-04-22 ENCOUNTER — Encounter: Payer: Self-pay | Admitting: Internal Medicine

## 2023-05-23 DIAGNOSIS — H52223 Regular astigmatism, bilateral: Secondary | ICD-10-CM | POA: Diagnosis not present

## 2023-05-23 DIAGNOSIS — H5203 Hypermetropia, bilateral: Secondary | ICD-10-CM | POA: Diagnosis not present

## 2023-05-23 DIAGNOSIS — H524 Presbyopia: Secondary | ICD-10-CM | POA: Diagnosis not present

## 2023-08-07 DIAGNOSIS — F17211 Nicotine dependence, cigarettes, in remission: Secondary | ICD-10-CM | POA: Diagnosis not present

## 2023-08-07 DIAGNOSIS — E663 Overweight: Secondary | ICD-10-CM | POA: Diagnosis not present

## 2023-08-07 DIAGNOSIS — M1990 Unspecified osteoarthritis, unspecified site: Secondary | ICD-10-CM | POA: Diagnosis not present

## 2023-08-07 DIAGNOSIS — F17290 Nicotine dependence, other tobacco product, uncomplicated: Secondary | ICD-10-CM | POA: Diagnosis not present

## 2023-08-07 DIAGNOSIS — Z008 Encounter for other general examination: Secondary | ICD-10-CM | POA: Diagnosis not present

## 2023-08-07 DIAGNOSIS — Z860100 Personal history of colon polyps, unspecified: Secondary | ICD-10-CM | POA: Diagnosis not present

## 2023-08-07 DIAGNOSIS — Z6828 Body mass index (BMI) 28.0-28.9, adult: Secondary | ICD-10-CM | POA: Diagnosis not present

## 2023-08-07 DIAGNOSIS — I129 Hypertensive chronic kidney disease with stage 1 through stage 4 chronic kidney disease, or unspecified chronic kidney disease: Secondary | ICD-10-CM | POA: Diagnosis not present

## 2023-08-07 DIAGNOSIS — E785 Hyperlipidemia, unspecified: Secondary | ICD-10-CM | POA: Diagnosis not present
